# Patient Record
Sex: Male | Born: 1960 | Race: Black or African American | Hispanic: No | Marital: Married
Health system: Southern US, Community
[De-identification: ages and names within clinical notes are randomized; demographics above are authoritative.]

---

## 2004-03-23 ENCOUNTER — Ambulatory Visit (HOSPITAL_COMMUNITY): Admission: RE | Admit: 2004-03-23 | Discharge: 2004-03-23 | Payer: Self-pay | Admitting: Internal Medicine

## 2004-05-26 ENCOUNTER — Inpatient Hospital Stay (HOSPITAL_COMMUNITY): Admission: EM | Admit: 2004-05-26 | Discharge: 2004-05-28 | Payer: Self-pay | Admitting: Emergency Medicine

## 2004-08-16 ENCOUNTER — Ambulatory Visit (HOSPITAL_COMMUNITY): Admission: RE | Admit: 2004-08-16 | Discharge: 2004-08-16 | Payer: Self-pay | Admitting: Internal Medicine

## 2004-08-23 ENCOUNTER — Ambulatory Visit: Payer: Self-pay | Admitting: Gastroenterology

## 2004-08-23 ENCOUNTER — Observation Stay (HOSPITAL_COMMUNITY): Admission: AD | Admit: 2004-08-23 | Discharge: 2004-08-24 | Payer: Self-pay | Admitting: Internal Medicine

## 2004-08-24 ENCOUNTER — Encounter: Payer: Self-pay | Admitting: Internal Medicine

## 2004-08-29 ENCOUNTER — Ambulatory Visit: Payer: Self-pay | Admitting: Internal Medicine

## 2004-08-30 ENCOUNTER — Ambulatory Visit (HOSPITAL_COMMUNITY): Admission: EM | Admit: 2004-08-30 | Discharge: 2004-08-30 | Payer: Self-pay | Admitting: Emergency Medicine

## 2004-09-20 ENCOUNTER — Ambulatory Visit: Payer: Self-pay | Admitting: Internal Medicine

## 2004-09-28 ENCOUNTER — Ambulatory Visit: Payer: Self-pay | Admitting: Psychiatry

## 2004-11-23 ENCOUNTER — Ambulatory Visit: Payer: Self-pay | Admitting: Psychiatry

## 2004-12-25 ENCOUNTER — Ambulatory Visit: Payer: Self-pay | Admitting: Internal Medicine

## 2005-01-04 ENCOUNTER — Ambulatory Visit (HOSPITAL_COMMUNITY): Admission: RE | Admit: 2005-01-04 | Discharge: 2005-01-04 | Payer: Self-pay | Admitting: General Surgery

## 2005-01-04 ENCOUNTER — Encounter (INDEPENDENT_AMBULATORY_CARE_PROVIDER_SITE_OTHER): Payer: Self-pay | Admitting: General Surgery

## 2005-03-03 ENCOUNTER — Emergency Department (HOSPITAL_COMMUNITY): Admission: EM | Admit: 2005-03-03 | Discharge: 2005-03-03 | Payer: Self-pay | Admitting: Emergency Medicine

## 2005-04-24 ENCOUNTER — Ambulatory Visit (HOSPITAL_COMMUNITY): Payer: Self-pay | Admitting: Psychiatry

## 2005-05-27 ENCOUNTER — Ambulatory Visit (HOSPITAL_COMMUNITY): Payer: Self-pay | Admitting: Psychiatry

## 2005-06-25 ENCOUNTER — Ambulatory Visit (HOSPITAL_COMMUNITY): Payer: Self-pay | Admitting: Psychiatry

## 2005-08-22 ENCOUNTER — Ambulatory Visit (HOSPITAL_COMMUNITY): Payer: Self-pay | Admitting: Psychiatry

## 2005-09-17 ENCOUNTER — Ambulatory Visit: Payer: Self-pay | Admitting: Internal Medicine

## 2005-09-20 ENCOUNTER — Encounter (INDEPENDENT_AMBULATORY_CARE_PROVIDER_SITE_OTHER): Payer: Self-pay | Admitting: Specialist

## 2005-09-20 ENCOUNTER — Ambulatory Visit: Payer: Self-pay | Admitting: Internal Medicine

## 2005-09-20 ENCOUNTER — Ambulatory Visit (HOSPITAL_COMMUNITY): Admission: RE | Admit: 2005-09-20 | Discharge: 2005-09-20 | Payer: Self-pay | Admitting: Internal Medicine

## 2005-10-21 ENCOUNTER — Ambulatory Visit (HOSPITAL_COMMUNITY): Payer: Self-pay | Admitting: Psychiatry

## 2005-10-28 ENCOUNTER — Ambulatory Visit: Payer: Self-pay | Admitting: Internal Medicine

## 2005-12-17 ENCOUNTER — Ambulatory Visit: Payer: Self-pay | Admitting: Internal Medicine

## 2005-12-17 ENCOUNTER — Ambulatory Visit (HOSPITAL_COMMUNITY): Admission: RE | Admit: 2005-12-17 | Discharge: 2005-12-17 | Payer: Self-pay | Admitting: Internal Medicine

## 2006-10-07 ENCOUNTER — Ambulatory Visit: Payer: Self-pay | Admitting: Gastroenterology

## 2006-10-08 ENCOUNTER — Ambulatory Visit (HOSPITAL_COMMUNITY): Admission: RE | Admit: 2006-10-08 | Discharge: 2006-10-08 | Payer: Self-pay | Admitting: Internal Medicine

## 2007-05-11 ENCOUNTER — Encounter (INDEPENDENT_AMBULATORY_CARE_PROVIDER_SITE_OTHER): Payer: Self-pay | Admitting: Family Medicine

## 2007-11-28 IMAGING — CT CT ABDOMEN W/ CM
1 of 3 series · 13 of 32 positions shown, 18 images · IV contrast (Omnipaque 300)
Comparison: None

ABDOMEN CT WITH CONTRAST

CLINICAL DATA: Left lower quadrant abdominal pain. Hematochezia.
TECHNIQUE: Multidetector CT imaging of the abdomen and pelvis was performed
following the standard protocol during bolus administration of intravenous
contrast.

Contrast:  120 cc Omnipaque 300

[Series 2: abd_pel 5.0 b40f · axial · 0.90mm/px · z∈[-491,-86]mm · 13 of 93 slices shown, 18 images]
[im 6/93  soft-tissue]
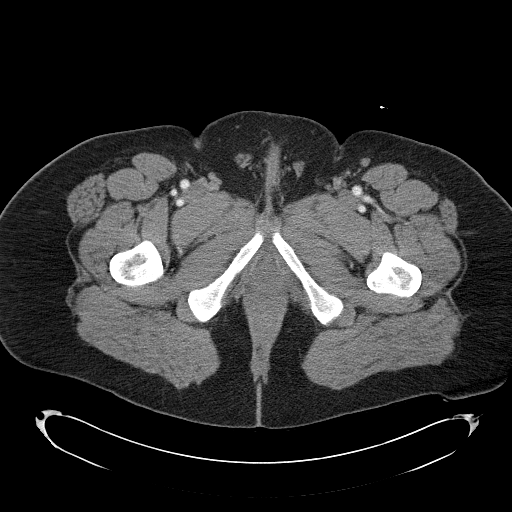
[im 6/93  bone]
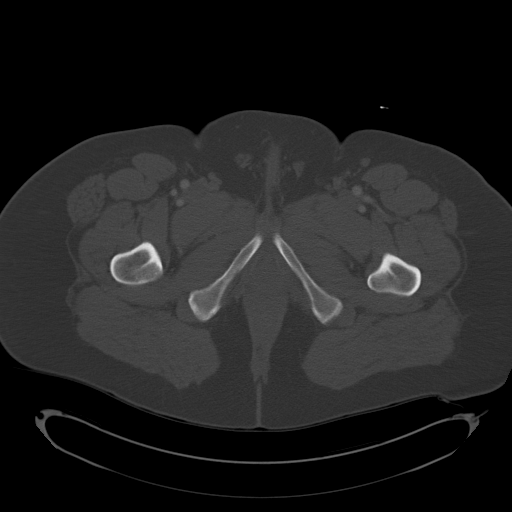
[im 16/93  soft-tissue]
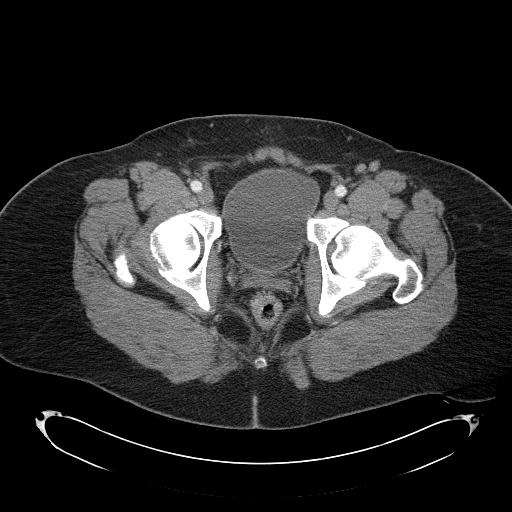
[im 21/93  soft-tissue]
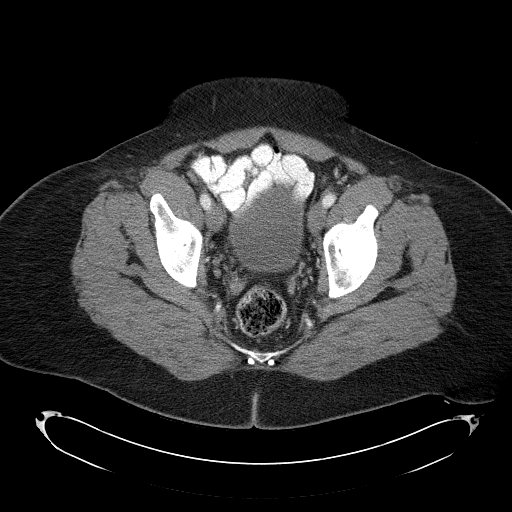
[im 26/93  soft-tissue]
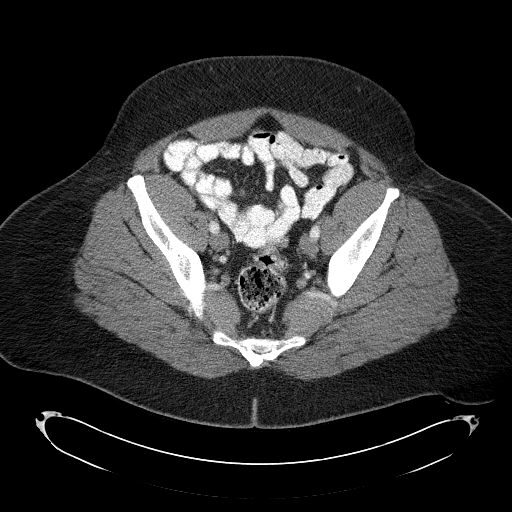
[im 36/93  soft-tissue]
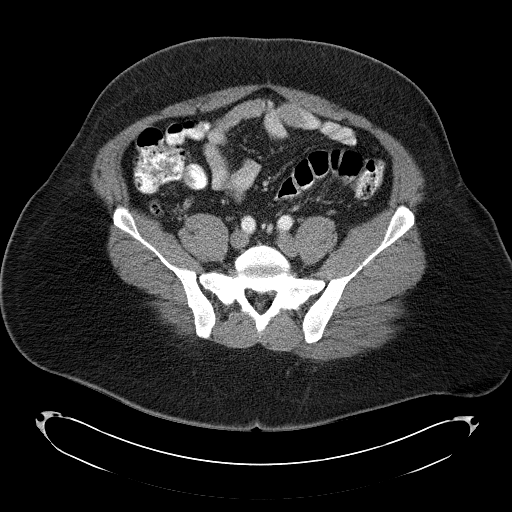
[im 41/93  soft-tissue]
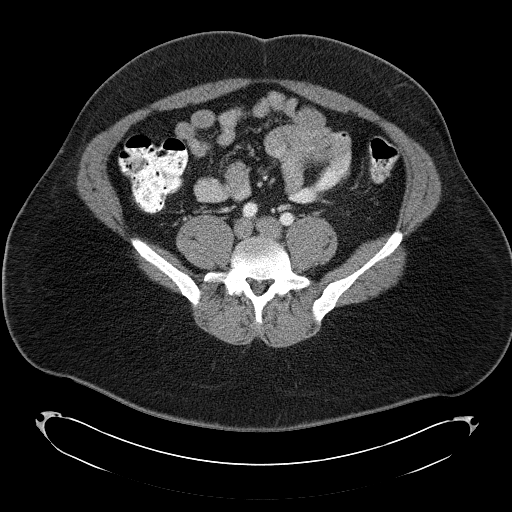
[im 52/93  soft-tissue]
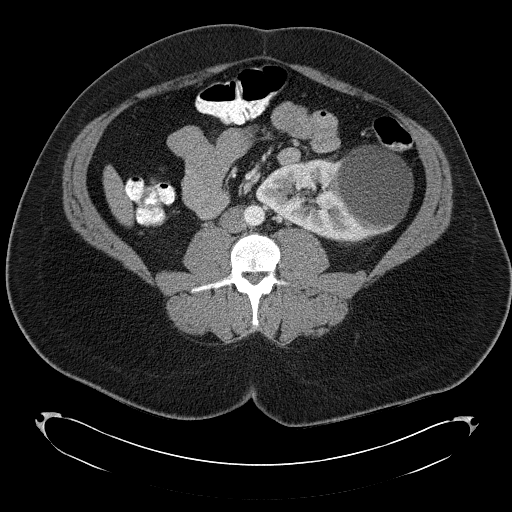
[im 57/93  soft-tissue]
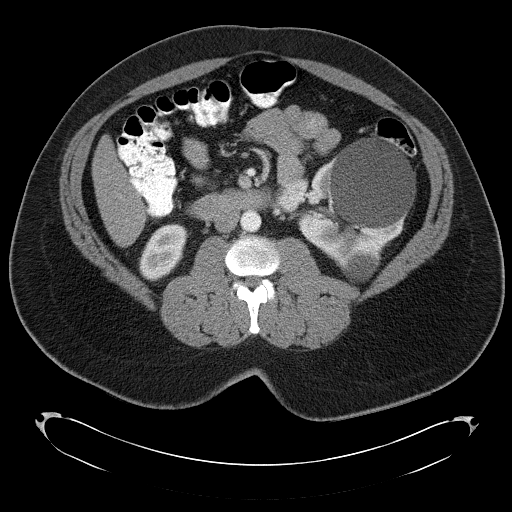
[im 67/93  soft-tissue]
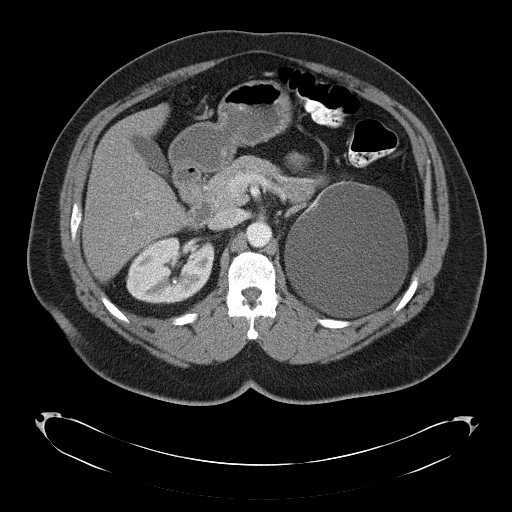
[im 67/93  bone]
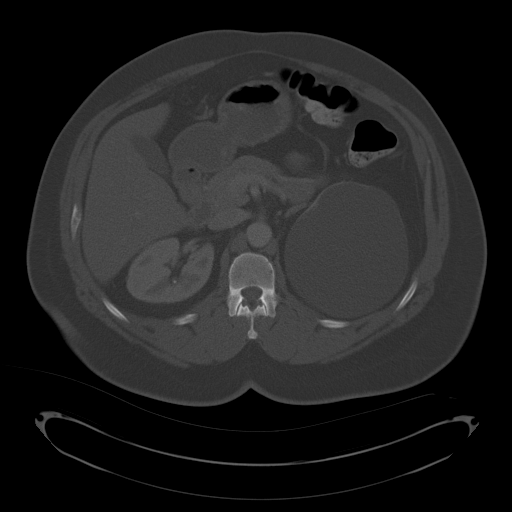
[im 72/93  soft-tissue]
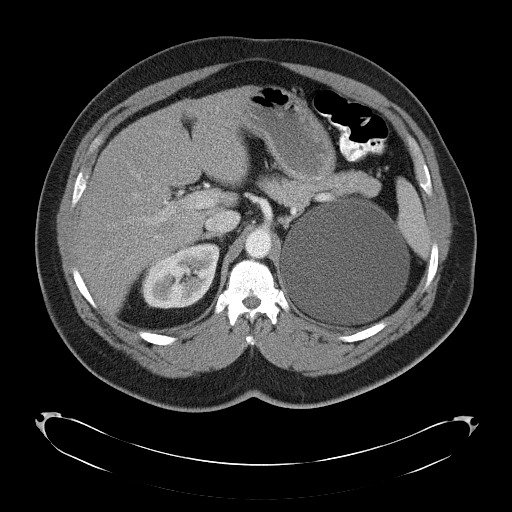
[im 72/93  lung]
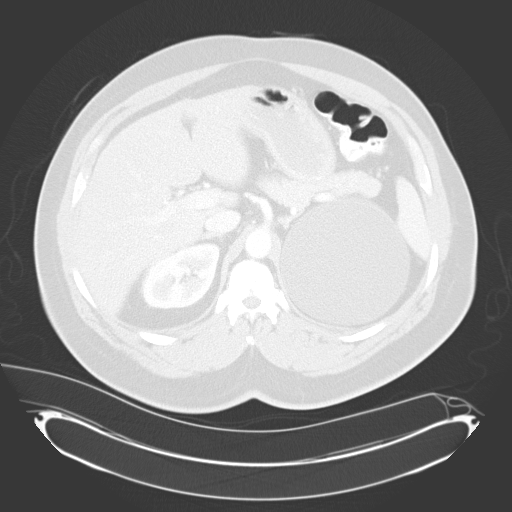
[im 77/93  soft-tissue]
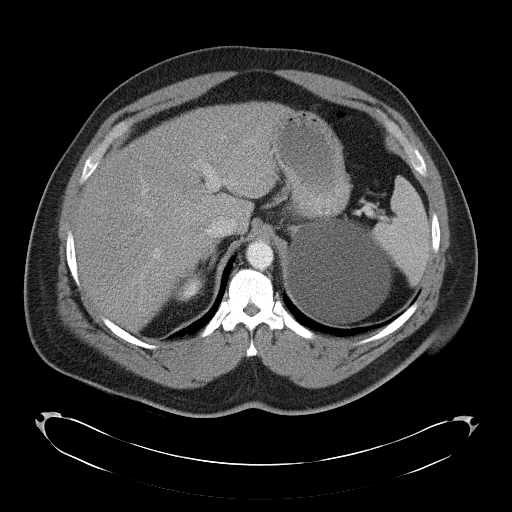
[im 77/93  lung]
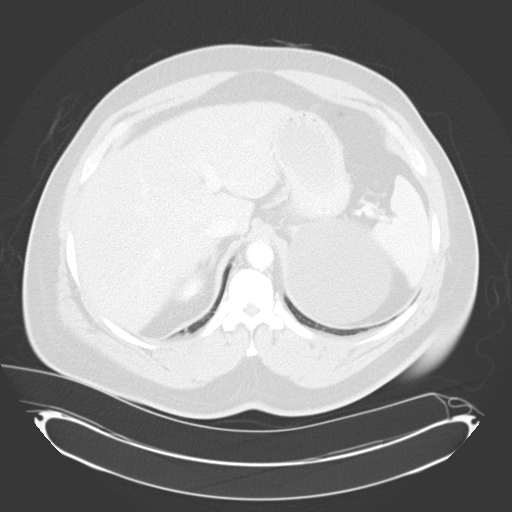
[im 82/93  lung]
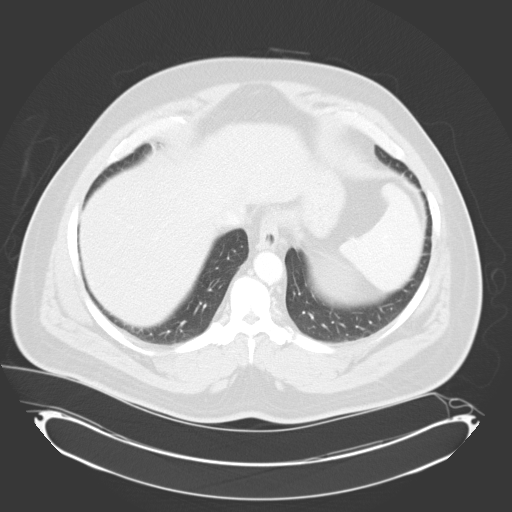
[im 87/93  soft-tissue]
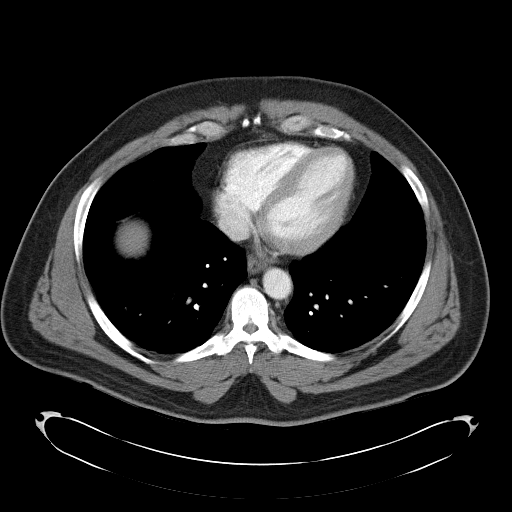
[im 87/93  lung]
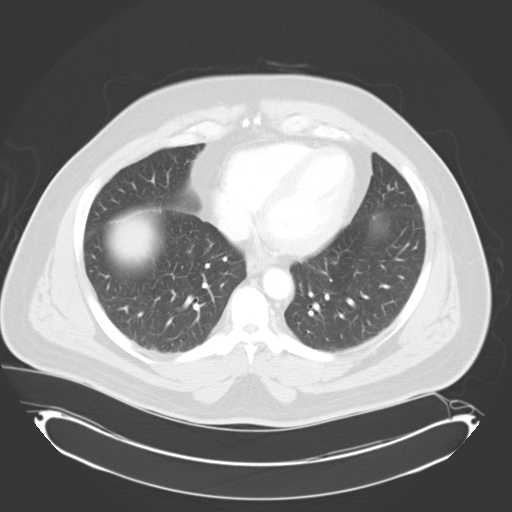

[13 of 32 positions shown; findings below may reference images not displayed]

FINDINGS: Diffuse fatty infiltration the liver is noted. Spleen appears
unremarkable. The pancreas and adrenal glands appear normal.

There are 2 large cysts in the left kidney. One of these measures 11.6 x 12.5 x
13.2 cm in the upper pole, and has a single thin septation along its lower
border, without nodularity. The second cyst measures 7.7 x 8.0 x 7.7 cm, and
appear simple.  These large cysts displace the axis of the left kidney, which is
somewhat transverse in the midportion and lower pole.

No pathologic fracture peritoneal adenopathy is identified. Right mid kidney
nonobstructive renal calculus measures 2 mm in diameter on image 27 series 2. No
additional nephrolithiasis identified. There appeared the 2 left renal arteries.

The appendix appears unremarkable. The abdominal aorta appears normal. No lumbar
malalignment.

IMPRESSION

1. Two large left-sided renal cyst, without internal nodularity. The upper pole
cyst appears to have a single thin linear septation or its lower margin. Overall
the cysts appear benign.
2. Right nephrolithiasis, nonobstructive.
3. Diffuse fatty infiltration of liver.

PELVIS CT WITH CONTRAST
FINDINGS: No findings of sigmoid diverticulosis or diverticulitis. No
inflammatory process in the left lower quadrant is identified. The small bowel
appears unremarkable.

There appear to be several small erosions along the pubic symphysis low with
mild sclerosis raising the possibility of mild osteitis pubis. No pathologic
pelvic adenopathy. No free pelvic fluid noted. Small inguinal lymph nodes are
not pathologically enlarged by CT size criteria.

IMPRESSION

1. Possible mild osteitis pubis, with several small erosions and mild sclerosis.
Otherwise negative.
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
## 2010-06-15 NOTE — Discharge Summary (Signed)
NAME:  Justin Yates, Justin Yates              ACCOUNT NO.:  [id number]   MEDICAL RECORD NO.:  [id number]          PATIENT TYPE:  INP   LOCATION:  A209                          FACILITY:  [facility]   PHYSICIAN:  Tesfaye D. Felecia Shelling, MD   DATE OF BIRTH:  1960-07-18   DATE OF ADMISSION:  05/26/2004  DATE OF DISCHARGE:  05/01/2006LH                                 DISCHARGE SUMMARY   DISCHARGE DIAGNOSES:  1.  Non-cardiac chest pain.  2.  Nicotine addiction.   DISCHARGE MEDICATIONS:  Nicotine patch 21 mg daily for one week and then to  be tapered to gradually and then discontinued over four-six weeks.   DISPOSITION:  The patient was discharged home in stable condition.   HOSPITAL COURSE:  This is a 50 year old male patient with no significant  past medical history, who was admitted due to chest pain.  The patient was  seen in the emergency room and he described his pain as tightness and  pressure.  He had serial EKGs and cardiac enzymes.  The patient then was  admitted under telemetry.  His cardiac workup was negative.  The patient was  discharged home in stable condition to follow as an outpatient.      TDF/MEDQ  D:  06/15/2004  T:  06/15/2004  Job:  [id number]
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
## 2010-06-15 NOTE — Op Note (Signed)
NAME:  Justin Yates, Justin Yates              ACCOUNT NO.:  [id number]   MEDICAL RECORD NO.:  [id number]          PATIENT TYPE:  AMB   LOCATION:  DAY                           FACILITY:  [facility]   PHYSICIAN:  R. Roetta Sessions, M.D. DATE OF BIRTH:  [birth year]   DATE OF PROCEDURE:  08/30/2004  DATE OF DISCHARGE:  08/30/2004                                 OPERATIVE REPORT   PROCEDURE:  Diagnostic sigmoidoscopy.   INDICATIONS FOR PROCEDURE:  A 50 year old gentleman with recurrent rectal  bleeding. His hemoglobin has remained in the normal range. It is low volume,  painless hematochezia. Sigmoidoscopy is now being done to further evaluate  his symptoms. This approach has been discussed with the patient at length.  Potential risks, benefits, and alternatives have been reviewed and questions  answered. He is agreeable. Please see documentation in the medical record.   PROCEDURE NOTE:  O2 saturation, blood pressure, pulse, and respirations were  monitored throughout the entire procedure. Conscious sedation with Versed 4  mg IV and Demerol 75 mg IV in divided doses.   INSTRUMENT:  Olympus video chip system.   FINDINGS:  Digital rectal exam revealed no abnormalities.   ENDOSCOPIC FINDINGS:  Prep was satisfactory.   Rectum:  Examination of the rectal mucosa including retroflexed view of the  anal verge revealed some stool in the rectum. The rectal mucosa that was  seen appeared entirely normal.   Colon:  Colonic mucosa was surveyed from the rectosigmoid junction up to 30  cm. The colonic mucosa appeared normal. There was no blood in the distal  lower GI tract. There was no mucosal abnormalities. The patient tolerated  the procedure well and was reactive to endoscopy.   IMPRESSION:  Normal sigmoidoscopy to 30 cm.   RECOMMENDATIONS:  1.  Constipation/hemorrhoid literature provided to [name].  2.  Complete a course of Canasa suppositories previously prescribed.  3.  Colace twice daily.  4.   Crystallose laxative 20 g orally at bedtime as needed for constipation.  5.  The patient is to let me know if he has any further rectal bleeding.      Jonathon Bellows, M.D.  Electronically Signed     RMR/MEDQ  D:  09/18/2004  T:  09/18/2004  Job:  [id number]   cc:   Tesfaye D. Felecia Shelling, MD  [address]  [place]  [place] [address]  Fax: [phone]

## 2010-06-15 NOTE — Op Note (Signed)
NAME:  Justin Yates, Justin Yates              ACCOUNT NO.:  [id number]   MEDICAL RECORD NO.:  [id number]          PATIENT TYPE:  INP   LOCATION:  A206                          FACILITY:  [facility]   PHYSICIAN:  R. Roetta Sessions, M.D. DATE OF BIRTH:  February 27, 1960   DATE OF PROCEDURE:  08/24/2004  DATE OF DISCHARGE:                                 OPERATIVE REPORT   PROCEDURE:  Esophagogastroduodenoscopy with Elease Hashimoto dilation followed by  colonoscopy, ileoscopy, biopsy.   INDICATIONS FOR PROCEDURE:  A 50 year old gentleman with a four to five day  history of intermittent hematochezia and intermittent black stools as well.  He has had some periumbilical abdominal pain as well as nausea and vomiting.  He has also had some hemoptysis. He also reports esophageal dysphagia. EGD  and colonoscopy are now being done. This approach has been discussed with  the patient at length. Potential risks, benefits, and alternatives have been  reviewed and questions answered. He is agreeable. Please see documentation  in the medical record.   PROCEDURE NOTE:  O2 saturation, blood pressure, pulse, and respirations were  monitored throughout the entire procedure. Conscious sedation with Versed 4  mg IV and Demerol 100 mg IV in divided doses.   INSTRUMENT:  Olympus video chip system.   FINDINGS:  Esophagus. Examination of the tubular esophagus revealed a  Schatzki's ring. It appeared noncritical to me. Otherwise, esophageal mucosa  appeared normal. EG junction was easily traversed.   Stomach:  Gastric cavity was empty and insufflated well with air. Thorough  examination of gastric mucosa including retroflexed view of the proximal  stomach and esophagogastric junction demonstrated small hiatal hernia.  Otherwise gastric mucosa appeared normal. Pylorus patent and easily  traversed. Examination of bulb and second portion revealed no abnormalities.   THERAPEUTIC/DIAGNOSTIC MANEUVERS:  A 51-French Maloney dilator was passed  to  full insertion. A look back revealed no apparent complication related to  passive of the dilator. The patient tolerated the procedure well and was  prepared for colonoscopy. Digital rectal examination revealed no  abnormalities.   ENDOSCOPIC FINDINGS:  Prep was fair. There was a large amount of fresh blood  in the rectum. It was easily washed away. Thorough examination of the rectal  mucosa was undertaken including retroflexed view from different angles. The  patient had no significant hemorrhoidal disease. No discrete bleeding lesion  was found, although there were multiple tiny erosions circumferential in the  distal rectum. No evidence of frank ulceration.   Colon:  Colonic mucosa was surveyed from the rectosigmoid junction through  the left, transverse, and right colon to the area of the appendiceal  orifice, ileocecal valve, and cecum. These structures were well seen and  photographed for the record. Terminal ileum was intubated to 10 cm. From  this level, the scope was slowly withdrawn, and all previously mentioned  mucosal surfaces were again seen. The patient was noted to have pan colonic  diverticula. There was a diminutive 4-mm polyp at the rectosigmoid junction  (20 cm). Terminal ileal mucosa appeared normal. The rectosigmoid polyp was  cold biopsied/removed. The scope was pulled back down  into the rectum, and  there was no blood present. I washed the rectal mucosa additionally and  again saw very small what appeared to be erosions in the distal rectum.  Distal rectal mucosa was biopsied for histologic study. The patient  tolerated the procedure well and was reactive to endoscopy.   IMPRESSION:  Esophagogastroduodenoscopy:  1.  Schatzki's ring. Otherwise normal esophagus. Status post dilation as      described above.  2.  Small hiatal hernia. Otherwise normal stomach. Normal D1 and D2.   Colonoscopy findings:  Fresh blood in the _____________. Tiny distal rectal   erosions status post biopsy. Rectosigmoid polyp diminutive at 20 cm, cold  biopsied/removed. Pan colonic diverticula. The remainder of colonic mucosa  and terminal ileum appeared normal.   Blood in the rectum was too much to implicate just enema tip trauma. He did  have fine distal rectal erosions, but I did not observe them to be bleeding.  The possibility of a rectal Dieulafoy or significant microscopic proctitis  remains in the differential.   RECOMMENDATIONS:  1.  A course of mesalamine suppositories with Canasa 1 gram suppository1 per      rectum at bedtime.  2.  Follow up on pathology.  3.  Further recommendations to follow.       RMR/MEDQ  D:  08/24/2004  T:  08/24/2004  Job:  [id number]

## 2010-06-15 NOTE — Op Note (Signed)
NAME:  Justin Yates, Justin Yates              ACCOUNT NO.:  [id number]   MEDICAL RECORD NO.:  [id number]          PATIENT TYPE:  AMB   LOCATION:  DAY                           FACILITY:  [facility]   PHYSICIAN:  R. Roetta Sessions, M.D. DATE OF BIRTH:  10-04-1960   DATE OF PROCEDURE:  09/20/2005  DATE OF DISCHARGE:                                 OPERATIVE REPORT   PROCEDURE PERFORMED:  Esophagogastroduodenoscopy and Maloney dilation  followed by biopsy, followed by diagnostic ileocolonoscopy.   INDICATIONS FOR PROCEDURE:  The patient is a 50 year old gentleman with  intermittent hematochezia, status post hemorrhoidectomy by Dr. Katrinka Blazing in  December of 2006.  He has esophageal dysphagia, no Schatzki's ring.  Esophagogastroduodenoscopy is now being done.  This approach has been  discussed with the patient at length, potential risks, benefits and  alternatives have been reviewed and questions answered.  The patient is  agreeable.  Please see documentation in the medical record.   Oxygen saturations, blood pressure, pulse and respirations were monitored  throughout the entirety of both procedures.   CONSCIOUS SEDATION BOTH PROCEDURES:  Versed 6 mg IV, Demerol 100 mg IV in  divided doses.  Cetacaine spray for topical pharyngeal anesthesia.   INSTRUMENT USED:  Olympus video chip system.   FINDINGS:  EGD:  Examination of the tubular esophagus revealed a noncritical-  appearing Schatzki's ring, esophageal mucosa otherwise appeared normal.  Esophagogastric junction easily traversed.   Stomach:  The gastric cavity was empty and insufflated well with air.  Thorough examination of the gastric mucosa including retroflex view of the  proximal stomach, esophagogastric junction demonstrated a small hiatal  hernia.  Patient had multiple linear erosions and ulcerations of the antrum  with some overlying eschar.  Please see photos.  These areas definitely had  depth, no obvious infiltrating process but there were  multiple.  Please see  photos.   Pylorus was patent and easily traversed.  Examination of the bulb, second  and third portion revealed no abnormalities.   THERAPY/DIAGNOSTIC MANEUVERS PERFORMED:  Biopsies of the gastric mucosa in  the area of the ulceration were taken for histologic study.  The patient  tolerated the procedure well and was prepared for colonoscopy.   Digital rectal exam revealed no abnormalities.   ENDOSCOPIC FINDINGS:  Prep was good.  Rectum:  Examination of the rectal mucosa including retroflex view of the  anal verge revealed only some friable anal canal hemorrhoids.  Colon:  Colonic mucosa was surveyed from the rectosigmoid junction to the  left, transverse, right colon to the area of the appendiceal orifice,  ileocecal valve and cecum.  These structures were well seen and photographed  for the record.  Terminal ileum was intubated to 20 cm.  From this level,  the scope was slowly withdrawn.  All previously mentioned mucosal surfaces  were again seen.  The patient was noted to have pancolonic diverticula.  Remainder of colonic mucosa appeared normal.  Terminal ileal mucosa appeared  normal.  The patient tolerated both procedures well and was reacted in  endoscopy.   IMPRESSION:  Noncritical Schatzki's ring, otherwise normal stomach.  Small  hiatal hernia, multiple linear erosions and frank ulcerations of the antrum  as described above, status post biopsy.  Remainder of gastric mucosa  appeared normal.  Patent pylorus, normal D1, D2.   COLONOSCOPY FINDINGS:  Anal canal hemorrhoids, otherwise normal rectum.  Pancolonic diverticula.  Remainder of colonic mucosa appeared normal.  Normal terminal ileum.   Patient could have easily bled from the stomach.  The only thing in his  colon were nonbleeding diverticula and some anal canal hemorrhoids.   FINDINGS:  Abnormal findings in the gastric mucosa suspicious for occult  NSAID use although the patient denies.    RECOMMENDATIONS:  CBC from a few days ago revealed a normal H&H at 13.7 and  40.0.  Will repeat that today.  Also check Helicobacter pylori serology.  The patient is advised to stay away from all nonsteroidals.  Follow-up on  pathology.  Continue Prevacid 30 mg orally daily.  Hemorrhoid literature provided Justin Yates.  10-day course of Anusol HC  suppositories, one per rectum at bedtime, further recommendations to follow.      Jonathon Bellows, M.D.  Electronically Signed     RMR/MEDQ  D:  09/20/2005  T:  09/20/2005  Job:  [id number]   cc:   Tesfaye D. Felecia Shelling, MD  Fax: [phone]

## 2010-06-15 NOTE — Op Note (Signed)
NAME:  Justin Yates, Justin Yates              ACCOUNT NO.:  [id number]   MEDICAL RECORD NO.:  [id number]          PATIENT TYPE:  AMB   LOCATION:  DAY                           FACILITY:  [facility]   PHYSICIAN:  Jerolyn Shin C. Katrinka Blazing, M.D.   DATE OF BIRTH:  06-12-1960   DATE OF PROCEDURE:  01/04/2005  DATE OF DISCHARGE:                                 OPERATIVE REPORT   PREOPERATIVE DIAGNOSIS:  Hemorrhoid disease, possible anal mass.   POSTOPERATIVE DIAGNOSIS:  Internal hemorrhoids.   PROCEDURE:  Hemorrhoidectomy.   SURGEON:  Dr. Katrinka Blazing.   DESCRIPTION:  Under general anesthesia, the patient was turned to the prone  position. He was prepped and draped in a sterile field. Packing was placed  in the anus to prevent any potential fecal contamination. Inspection of the  anus did not reveal any anal mass. There was a right anterior and a left  lateral large complex of internal hemorrhoids. The base of each hemorrhoid  complex was ligated with 2-0 chromic. The hemorrhoidal complex was excised  completely in a subcutaneous plane. The mucosa was reapproximated with a  running locking 2-0 chromic. This was done for each of the involved areas.  Inspection otherwise did not reveal any abnormality of the anal canal or the  lower rectum. The patient tolerated the procedure well. Each area of the  operative field was locally infiltrated with 0.5% Marcaine with epinephrine  1:200,000 with 10 cc under each operative site. The patient tolerated the  procedure well. He was turned to the supine position, extubated, and  transferred to the post-anesthesia care unit in satisfactory condition.      Dirk Dress. Katrinka Blazing, M.D.  Electronically Signed     LCS/MEDQ  D:  01/04/2005  T:  01/04/2005  Job:  [id number]   cc:   Lionel December, M.D.  [address]  [place]  [place] [id number]   Tesfaye D. Felecia Shelling, MD  Fax: [phone]

## 2010-06-15 NOTE — Op Note (Signed)
NAME:  Justin Yates, Justin Yates              ACCOUNT NO.:  [id number]   MEDICAL RECORD NO.:  [id number]          PATIENT TYPE:  AMB   LOCATION:  DAY                           FACILITY:  [facility]   PHYSICIAN:  R. Roetta Sessions, M.D. DATE OF BIRTH:  January 22, 1961   DATE OF PROCEDURE:  12/17/2005  DATE OF DISCHARGE:                                 OPERATIVE REPORT   PROCEDURE:  Esophagogastroduodenoscopy with Elease Hashimoto dilation.   INDICATIONS FOR PROCEDURE:  The patient is a 50 year old African-American  male who was found to have gastric ulcer disease on EGD back in August of  this year.  He also had a Schatzki's ring for which a 46-50 [place] Maloney  dilator was passed.  His dysphagia symptoms improved but only for a short  period of time.  Subsequently it recurred and he is now having any reflux  symptoms.  He had Helicobacter pylori based on serology and was treated with  triple drug therapy.  He now comes for follow-up EGD.  Previously the  gastric ulcers were biopsied and histology was benign.  This approach has  been discussed with the patient at length.  Potential risks, benefits and  alternatives have been reviewed.  Questions answered.  He is agreeable.  Please see documentation in patient's medical record.   PROCEDURE NOTE:  O2 saturation, blood pressure, pulse and respirations were  monitored throughout the entirety of the procedure.   CONSCIOUS SEDATION:  Demerol 100 mg, Versed 4 mg IV in divided doses.   INSTRUMENT:  Olympus video chip system.   FINDINGS:  Examination of the tubular esophagus revealed a noncritical  Schatzki's ring.  Otherwise, the esophageal mucosa appeared normal.  EG  junction was easily traversed.   Stomach:  Gastric empty was empty.  It insufflated well with air.  Thorough  examination of the gastric mucosa with retroflexion in the proximal stomach,  esophagogastric junction demonstrated only a small hiatal hernia and some  scar in the antrum over a little patchy  erythema.  The previously noted  ulcers were completely healed.  Please see photos.  The pylorus was patent  and easily traversed.  Examination of the bulb and second portion revealed  no abnormalities.   THERAPIES/DIAGNOSTIC MANEUVERS PERFORMED:  A 58-Frnech Maloney dilator was  passed to full insertion with ease.  A look back revealed no apparent  complications after passage of the dilator.  The patient tolerated the  procedure well.   ENDOSCOPIC IMPRESSION:  1. Noncritical Schatzki's ring status post dilation described above,      otherwise normal to the esophagus.  2. Small hiatal hernia.  3. Patchy erythema and scar of the antrum, otherwise normal stomach.  4. Patent pylorus, normal D1 and D2.   RECOMMENDATIONS:  No scheduled further evaluation at this time from a GI  standpoint.  The patient is to let me know if he has any future swallowing  difficulties.      Jonathon Bellows, M.D.  Electronically Signed     RMR/MEDQ  D:  12/17/2005  T:  12/17/2005  Job:  [id number]   cc:  [facility]  Fax: [phone]

## 2018-02-28 NOTE — Congregational Nurse Program (Signed)
Seen  at the [employer]. No complaints. B P 112/69 P 86. Blood Glucose [address] RN, [facility], [phone]

## 2018-11-19 ENCOUNTER — Other Ambulatory Visit: Payer: Self-pay

## 2018-11-19 DIAGNOSIS — Z20822 Contact with and (suspected) exposure to covid-19: Secondary | ICD-10-CM

## 2018-11-21 LAB — NOVEL CORONAVIRUS, NAA: SARS-CoV-2, NAA: NOT DETECTED

## 2022-09-02 DIAGNOSIS — R197 Diarrhea, unspecified: Secondary | ICD-10-CM | POA: Diagnosis not present

## 2022-09-02 DIAGNOSIS — N281 Cyst of kidney, acquired: Secondary | ICD-10-CM | POA: Diagnosis not present

## 2022-09-02 DIAGNOSIS — E278 Other specified disorders of adrenal gland: Secondary | ICD-10-CM | POA: Diagnosis not present

## 2022-09-02 DIAGNOSIS — R10812 Left upper quadrant abdominal tenderness: Secondary | ICD-10-CM | POA: Diagnosis not present

## 2022-09-02 DIAGNOSIS — R932 Abnormal findings on diagnostic imaging of liver and biliary tract: Secondary | ICD-10-CM | POA: Diagnosis not present

## 2022-09-02 DIAGNOSIS — R112 Nausea with vomiting, unspecified: Secondary | ICD-10-CM | POA: Diagnosis not present

## 2022-09-02 DIAGNOSIS — R109 Unspecified abdominal pain: Secondary | ICD-10-CM | POA: Diagnosis not present
